# Patient Record
Sex: Male | Born: 2005 | Race: White | Hispanic: No | Marital: Single | Smoking: Never smoker
Health system: Southern US, Community
[De-identification: ages and names within clinical notes are randomized; demographics above are authoritative.]

## PROBLEM LIST (undated history)

## (undated) DIAGNOSIS — F419 Anxiety disorder, unspecified: Secondary | ICD-10-CM

## (undated) DIAGNOSIS — F32A Depression, unspecified: Secondary | ICD-10-CM

## (undated) DIAGNOSIS — F4310 Post-traumatic stress disorder, unspecified: Secondary | ICD-10-CM

## (undated) DIAGNOSIS — F909 Attention-deficit hyperactivity disorder, unspecified type: Secondary | ICD-10-CM

## (undated) HISTORY — DX: Attention-deficit hyperactivity disorder, unspecified type: F90.9

## (undated) HISTORY — DX: Depression, unspecified: F32.A

## (undated) HISTORY — DX: Post-traumatic stress disorder, unspecified: F43.10

## (undated) HISTORY — DX: Anxiety disorder, unspecified: F41.9

---
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
